# Patient Record
Sex: Female | Born: 1990 | Race: Black or African American | Hispanic: No | Marital: Single | Smoking: Never smoker
Health system: Southern US, Community
[De-identification: ages and names within clinical notes are randomized; demographics above are authoritative.]

## PROBLEM LIST (undated history)

## (undated) ENCOUNTER — Inpatient Hospital Stay (HOSPITAL_COMMUNITY): Payer: Self-pay

## (undated) DIAGNOSIS — K219 Gastro-esophageal reflux disease without esophagitis: Secondary | ICD-10-CM

---

## 2010-09-19 ENCOUNTER — Emergency Department (HOSPITAL_COMMUNITY)
Admission: EM | Admit: 2010-09-19 | Discharge: 2010-09-19 | Disposition: A | Discharge status: Home / Self Care | Payer: Managed Care, Other (non HMO) | Attending: Emergency Medicine | Admitting: Emergency Medicine

## 2010-09-19 ENCOUNTER — Inpatient Hospital Stay (INDEPENDENT_AMBULATORY_CARE_PROVIDER_SITE_OTHER)
Admission: RE | Admit: 2010-09-19 | Discharge: 2010-09-19 | Disposition: A | Discharge status: Home / Self Care | Payer: Managed Care, Other (non HMO) | Source: Ambulatory Visit | Attending: Emergency Medicine | Admitting: Emergency Medicine

## 2010-09-19 DIAGNOSIS — R112 Nausea with vomiting, unspecified: Secondary | ICD-10-CM | POA: Insufficient documentation

## 2010-09-19 DIAGNOSIS — E860 Dehydration: Secondary | ICD-10-CM | POA: Insufficient documentation

## 2010-09-19 DIAGNOSIS — K5289 Other specified noninfective gastroenteritis and colitis: Secondary | ICD-10-CM | POA: Insufficient documentation

## 2010-09-19 DIAGNOSIS — R197 Diarrhea, unspecified: Secondary | ICD-10-CM | POA: Insufficient documentation

## 2010-09-19 DIAGNOSIS — K922 Gastrointestinal hemorrhage, unspecified: Secondary | ICD-10-CM

## 2010-09-19 DIAGNOSIS — R109 Unspecified abdominal pain: Secondary | ICD-10-CM | POA: Insufficient documentation

## 2010-09-19 LAB — COMPREHENSIVE METABOLIC PANEL
BUN: 8 mg/dL (ref 6–23)
CO2: 26 mEq/L (ref 19–32)
Calcium: 9.9 mg/dL (ref 8.4–10.5)
GFR calc Af Amer: >60 mL/min (ref >60)
GFR calc non Af Amer: >60 mL/min (ref >60)
Glucose, Bld: 106 mg/dL — ABNORMAL HIGH (ref 70–99)
Total Protein: 7.5 g/dL (ref 6.0–8.3)

## 2010-09-19 LAB — URINALYSIS, MICROSCOPIC ONLY
Ketones, ur: 40 mg/dL — AB
Nitrite: NEGATIVE
Specific Gravity, Urine: 1.026 (ref 1.005–1.030)
Urobilinogen, UA: 1 mg/dL (ref 0.0–1.0)
pH: 8 (ref 5.0–8.0)

## 2010-09-19 LAB — CBC
HCT: 39.5 % (ref 36.0–46.0)
Hemoglobin: 13.9 g/dL (ref 12.0–15.0)
MCH: 30.4 pg (ref 26.0–34.0)
MCHC: 35.2 g/dL (ref 30.0–36.0)
MCV: 86.4 fL (ref 78.0–100.0)

## 2010-09-19 LAB — POCT URINALYSIS DIP (DEVICE)
Hgb urine dipstick: NEGATIVE
Ketones, ur: 40 mg/dL — AB
Leukocytes, UA: NEGATIVE
Protein, ur: 30 mg/dL — AB
pH: 7.5 (ref 5.0–8.0)

## 2010-09-19 LAB — DIFFERENTIAL
Lymphocytes Relative: 22 % (ref 12–46)
Lymphs Abs: 1.4 10*3/uL (ref 0.7–4.0)
Monocytes Absolute: 0.3 10*3/uL (ref 0.1–1.0)
Monocytes Relative: 4 % (ref 3–12)
Neutro Abs: 4.5 10*3/uL (ref 1.7–7.7)

## 2010-09-21 ENCOUNTER — Emergency Department (HOSPITAL_COMMUNITY)
Admission: EM | Admit: 2010-09-21 | Discharge: 2010-09-21 | Disposition: A | Discharge status: Home / Self Care | Payer: Managed Care, Other (non HMO) | Attending: Emergency Medicine | Admitting: Emergency Medicine

## 2010-09-21 DIAGNOSIS — R112 Nausea with vomiting, unspecified: Secondary | ICD-10-CM | POA: Insufficient documentation

## 2010-09-21 DIAGNOSIS — R1013 Epigastric pain: Secondary | ICD-10-CM | POA: Insufficient documentation

## 2010-09-21 DIAGNOSIS — R197 Diarrhea, unspecified: Secondary | ICD-10-CM | POA: Insufficient documentation

## 2010-09-21 LAB — POCT I-STAT, CHEM 8
Calcium, Ion: 1.2 mmol/L (ref 1.12–1.32)
Chloride: 102 mEq/L (ref 96–112)
Creatinine, Ser: 0.8 mg/dL (ref 0.50–1.10)
Glucose, Bld: 91 mg/dL (ref 70–99)
HCT: 39 % (ref 36.0–46.0)

## 2010-09-21 LAB — COMPREHENSIVE METABOLIC PANEL
ALT: 11 U/L (ref 0–35)
Alkaline Phosphatase: 42 U/L (ref 39–117)
BUN: 7 mg/dL (ref 6–23)
Chloride: 105 mEq/L (ref 96–112)
GFR calc Af Amer: >60 mL/min (ref >60)
Glucose, Bld: 90 mg/dL (ref 70–99)
Potassium: 3.6 mEq/L (ref 3.5–5.1)
Sodium: 141 mEq/L (ref 135–145)
Total Bilirubin: 1 mg/dL (ref 0.3–1.2)
Total Protein: 6.7 g/dL (ref 6.0–8.3)

## 2010-09-21 LAB — DIFFERENTIAL
Basophils Absolute: 0 10*3/uL (ref 0.0–0.1)
Basophils Relative: 0 % (ref 0–1)
Lymphocytes Relative: 17 % (ref 12–46)
Monocytes Absolute: 0.3 10*3/uL (ref 0.1–1.0)
Neutro Abs: 4.6 10*3/uL (ref 1.7–7.7)
Neutrophils Relative %: 78 % — ABNORMAL HIGH (ref 43–77)

## 2010-09-21 LAB — CBC
HCT: 36.6 % (ref 36.0–46.0)
Hemoglobin: 12.6 g/dL (ref 12.0–15.0)
MCHC: 34.4 g/dL (ref 30.0–36.0)
RBC: 4.25 MIL/uL (ref 3.87–5.11)
WBC: 5.9 10*3/uL (ref 4.0–10.5)

## 2010-09-21 LAB — URINE MICROSCOPIC-ADD ON

## 2010-09-21 LAB — URINALYSIS, ROUTINE W REFLEX MICROSCOPIC
Bilirubin Urine: NEGATIVE
Glucose, UA: NEGATIVE mg/dL
Hgb urine dipstick: NEGATIVE
Specific Gravity, Urine: 1.021 (ref 1.005–1.030)
Urobilinogen, UA: 1 mg/dL (ref 0.0–1.0)

## 2010-09-21 LAB — LIPASE, BLOOD: Lipase: 17 U/L (ref 11–59)

## 2013-02-03 NOTE — L&D Delivery Note (Signed)
Operative Delivery Note At 7:10 AM a viable female was delivered via Vaginal, Spontaneous Delivery.  Presentation: vertex; Position: Occiput,, Anterior  Pt ruptured ~27 hours.  Ampicillin received at 0900.  Pt remained afebrile until we started pushing. Axillary temperature 100.7.  Unasyn 3 grams administered. Pt pushed well for 50 minutes.  Delivery of the head: 11/29/2013  7:09 PM Tight nuchal chord surgically reduced.  First maneuver:  11/29/2013  7:09 PM , Suprapubic Pressure McRoberts, Head transverse, occiput to left  Second maneuver: 11/29/2013  7:10 PM, McRoberts Suprapubic Pressure Attempted to deliver posterior arm, unsuccessful  Third maneuver: ,  With suprapubic pressure, occiput to right, anterior shoulder delivered  Per RN, dystocia was 70 seconds.  Verbal consent: Pt informed of dystocia.  APGAR: 2, 3; weight 8 lb 0.8 oz (3650 g).   Placenta status: Intact, Spontaneous Pathology.   Cord: 3 vessels with the following complications: None.  Cord pH:  7.29 -arterial (small sample), venous collected  Anesthesia: Local Epidural  Episiotomy: None Lacerations: Vaginal;Labial Suture Repair: 2.0 chromic Est. Blood Loss (mL): 450  Placenta delivered at 30 minutes. Uterine atony-Cytotec 800 mcg PR, Methergine IM, I/O bladder.  Bleeding resolved.  Mom to postpartum.  Baby to NICU.  Geryl RankinsVARNADO, Jovanna Hodges 11/29/2013, 7:51 PM

## 2013-04-13 LAB — OB RESULTS CONSOLE RUBELLA ANTIBODY, IGM: RUBELLA: IMMUNE

## 2013-04-13 LAB — OB RESULTS CONSOLE ANTIBODY SCREEN: Antibody Screen: NEGATIVE

## 2013-04-13 LAB — OB RESULTS CONSOLE ABO/RH: RH TYPE: POSITIVE

## 2013-04-13 LAB — OB RESULTS CONSOLE HIV ANTIBODY (ROUTINE TESTING): HIV: NONREACTIVE

## 2013-04-13 LAB — OB RESULTS CONSOLE RPR: RPR: NONREACTIVE

## 2013-04-13 LAB — OB RESULTS CONSOLE HEPATITIS B SURFACE ANTIGEN: HEP B S AG: NEGATIVE

## 2013-04-26 ENCOUNTER — Other Ambulatory Visit (HOSPITAL_COMMUNITY)
Admission: RE | Admit: 2013-04-26 | Discharge: 2013-04-26 | Disposition: A | Discharge status: Home / Self Care | Payer: Managed Care, Other (non HMO) | Source: Ambulatory Visit | Attending: Obstetrics and Gynecology | Admitting: Obstetrics and Gynecology

## 2013-04-26 ENCOUNTER — Other Ambulatory Visit: Payer: Self-pay | Admitting: Obstetrics and Gynecology

## 2013-04-26 DIAGNOSIS — Z01419 Encounter for gynecological examination (general) (routine) without abnormal findings: Secondary | ICD-10-CM | POA: Insufficient documentation

## 2013-04-26 LAB — OB RESULTS CONSOLE GC/CHLAMYDIA
Chlamydia: NEGATIVE
Gonorrhea: NEGATIVE

## 2013-08-23 ENCOUNTER — Inpatient Hospital Stay (HOSPITAL_COMMUNITY)
Admission: AD | Admit: 2013-08-23 | Discharge: 2013-08-23 | Disposition: A | Discharge status: Home / Self Care | Payer: Managed Care, Other (non HMO) | Source: Ambulatory Visit | Attending: Obstetrics & Gynecology | Admitting: Obstetrics & Gynecology

## 2013-08-23 ENCOUNTER — Encounter (HOSPITAL_COMMUNITY): Payer: Self-pay

## 2013-08-23 ENCOUNTER — Inpatient Hospital Stay (HOSPITAL_COMMUNITY): Payer: Managed Care, Other (non HMO)

## 2013-08-23 DIAGNOSIS — W1809XA Striking against other object with subsequent fall, initial encounter: Secondary | ICD-10-CM | POA: Insufficient documentation

## 2013-08-23 DIAGNOSIS — Y929 Unspecified place or not applicable: Secondary | ICD-10-CM | POA: Insufficient documentation

## 2013-08-23 DIAGNOSIS — R55 Syncope and collapse: Secondary | ICD-10-CM

## 2013-08-23 DIAGNOSIS — O2650 Maternal hypotension syndrome, unspecified trimester: Secondary | ICD-10-CM | POA: Insufficient documentation

## 2013-08-23 LAB — COMPREHENSIVE METABOLIC PANEL
ALT: 9 U/L (ref 0–35)
AST: 15 U/L (ref 0–37)
Albumin: 3.2 g/dL — ABNORMAL LOW (ref 3.5–5.2)
Alkaline Phosphatase: 47 U/L (ref 39–117)
Anion gap: 12 (ref 5–15)
BUN: 6 mg/dL (ref 6–23)
CO2: 25 mEq/L (ref 19–32)
Calcium: 9.6 mg/dL (ref 8.4–10.5)
Chloride: 100 mEq/L (ref 96–112)
Creatinine, Ser: 0.66 mg/dL (ref 0.50–1.10)
GFR calc Af Amer: >90 mL/min (ref >90)
GFR calc non Af Amer: >90 mL/min (ref >90)
Glucose, Bld: 76 mg/dL (ref 70–99)
Potassium: 3.9 mEq/L (ref 3.7–5.3)
Sodium: 137 mEq/L (ref 137–147)
Total Bilirubin: 0.6 mg/dL (ref 0.3–1.2)
Total Protein: 6.6 g/dL (ref 6.0–8.3)

## 2013-08-23 LAB — CBC
HCT: 34.2 % — ABNORMAL LOW (ref 36.0–46.0)
Hemoglobin: 11.7 g/dL — ABNORMAL LOW (ref 12.0–15.0)
MCH: 30.9 pg (ref 26.0–34.0)
MCHC: 34.2 g/dL (ref 30.0–36.0)
MCV: 90.2 fL (ref 78.0–100.0)
Platelets: 202 10*3/uL (ref 150–400)
RBC: 3.79 MIL/uL — ABNORMAL LOW (ref 3.87–5.11)
RDW: 12.8 % (ref 11.5–15.5)
WBC: 9.3 10*3/uL (ref 4.0–10.5)

## 2013-08-23 NOTE — MAU Note (Signed)
Pt presents after a fall at 1600. States she fell on her side and hit her face. Does not believe she hit her abdomen. Reports good fetal movement. Denies vaginal bleeding or discharge.

## 2013-08-23 NOTE — MAU Provider Note (Signed)
History     CSN: [id number]  Arrival date and time: 08/23/13 1757   None     Chief Complaint  Patient presents with  . Fall   HPIpt is G1P0 at [redacted]w[redacted]d pregnant and reports syncope episode at 1600 and fell on her face.  Pt does not think She hit her abdomen; denies vaginal bleeding, discharge or LOF. Pt had eaten cereal about 10:30am.  RN note: Sharen Hint, RN Registered Nurse Signed  MAU Note Service date: 08/23/2013 6:11 PM   Pt presents after a fall at 1600. States she fell on her side and hit her face. Does not believe she hit her abdomen. Reports good fetal movement. Denies vaginal bleeding or discharge.        History reviewed. No pertinent past medical history.  History reviewed. No pertinent past surgical history.  History reviewed. No pertinent family history.  History  Substance Use Topics  . Smoking status: Never Smoker   . Smokeless tobacco: Not on file  . Alcohol Use: No    Allergies: No Known Allergies  No prescriptions prior to admission    Review of Systems  Gastrointestinal: Negative for nausea, vomiting, abdominal pain, diarrhea and constipation.  Genitourinary: Negative for dysuria and urgency.   Physical Exam   Blood pressure 119/57, pulse 68, temperature 98 F (36.7 C), temperature source Oral, resp. rate 18, last menstrual period 02/13/2013.  Physical Exam  Nursing note and vitals reviewed. Constitutional: She is oriented to person, place, and time. She appears well-developed and well-nourished. No distress.  HENT:  Head: Normocephalic.  Reddened area above lip where pt thinks she got a rug burn from the fall- not bleeding  Eyes: Pupils are equal, round, and reactive to light.  Neck: Normal range of motion. Neck supple.  Cardiovascular: Normal rate.   Respiratory: Effort normal.  GI: Soft. She exhibits no distension. There is no tenderness. There is no rebound and no guarding.  No ctx;   Musculoskeletal: Normal range of motion.   Neurological: She is alert and oriented to person, place, and time.  Skin: Skin is warm and dry.  Psychiatric: She has a normal mood and affect.    MAU Course  Procedures Results for orders placed during the hospital encounter of 08/23/13 (from the past 24 hour(s))  CBC     Status: Abnormal   Collection Time    08/23/13  6:11 PM      Result Value Ref Range   WBC 9.3  4.0 - 10.5 K/uL   RBC 3.79 (*) 3.87 - 5.11 MIL/uL   Hemoglobin 11.7 (*) 12.0 - 15.0 g/dL   HCT 47.8 (*) 29.5 - 62.1 %   MCV 90.2  78.0 - 100.0 fL   MCH 30.9  26.0 - 34.0 pg   MCHC 34.2  30.0 - 36.0 g/dL   RDW 30.8  65.7 - 84.6 %   Platelets 202  150 - 400 K/uL  COMPREHENSIVE METABOLIC PANEL     Status: Abnormal   Collection Time    08/23/13  6:11 PM      Result Value Ref Range   Sodium 137  137 - 147 mEq/L   Potassium 3.9  3.7 - 5.3 mEq/L   Chloride 100  96 - 112 mEq/L   CO2 25  19 - 32 mEq/L   Glucose, Bld 76  70 - 99 mg/dL   BUN 6  6 - 23 mg/dL   Creatinine, Ser 9.62  0.50 - 1.10 mg/dL   Calcium  9.6  8.4 - 10.5 mg/dL   Total Protein 6.6  6.0 - 8.3 g/dL   Albumin 3.2 (*) 3.5 - 5.2 g/dL   AST 15  0 - 37 U/L   ALT 9  0 - 35 U/L   Alkaline Phosphatase 47  39 - 117 U/L   Total Bilirubin 0.6  0.3 - 1.2 mg/dL   GFR calc non Af Amer >90  >90 mL/min   GFR calc Af Amer >90  >90 mL/min   Anion gap 12  5 - 15   Preliminary ultrasound results- no placental abruption; posterior placenta above cervical os; normal appearing ultrasound Pt denies uterine irritability or cramping; FHR 6-25 beats accel with 10x10; no deceleration - no ctx noted; monitored for total of 4 hours after episode Discussed with Dr. Charlotta Newtonzan Assessment and Plan  Syncope in pregnancy F/u with scheduled OB appointment- sooner if any pain or bleeding or  concerns  [place] 08/23/2013, 6:34 PM

## 2013-10-20 LAB — OB RESULTS CONSOLE GBS: STREP GROUP B AG: NEGATIVE

## 2013-11-27 ENCOUNTER — Encounter (HOSPITAL_COMMUNITY): Payer: Self-pay | Admitting: *Deleted

## 2013-11-27 ENCOUNTER — Inpatient Hospital Stay (HOSPITAL_COMMUNITY)
Admission: AD | Admit: 2013-11-27 | Discharge: 2013-12-01 | DRG: 774 | Disposition: A | Discharge status: Home / Self Care | Payer: Managed Care, Other (non HMO) | Source: Ambulatory Visit | Attending: Obstetrics and Gynecology | Admitting: Obstetrics and Gynecology

## 2013-11-27 DIAGNOSIS — O480 Post-term pregnancy: Principal | ICD-10-CM | POA: Diagnosis present

## 2013-11-27 DIAGNOSIS — O9902 Anemia complicating childbirth: Secondary | ICD-10-CM | POA: Diagnosis present

## 2013-11-27 DIAGNOSIS — Z3490 Encounter for supervision of normal pregnancy, unspecified, unspecified trimester: Secondary | ICD-10-CM

## 2013-11-27 DIAGNOSIS — F329 Major depressive disorder, single episode, unspecified: Secondary | ICD-10-CM | POA: Diagnosis present

## 2013-11-27 DIAGNOSIS — K219 Gastro-esophageal reflux disease without esophagitis: Secondary | ICD-10-CM | POA: Diagnosis present

## 2013-11-27 DIAGNOSIS — Z3A41 41 weeks gestation of pregnancy: Secondary | ICD-10-CM | POA: Diagnosis present

## 2013-11-27 DIAGNOSIS — O99214 Obesity complicating childbirth: Secondary | ICD-10-CM | POA: Diagnosis present

## 2013-11-27 DIAGNOSIS — S70222A Blister (nonthermal), left hip, initial encounter: Secondary | ICD-10-CM

## 2013-11-27 DIAGNOSIS — Z6830 Body mass index (BMI) 30.0-30.9, adult: Secondary | ICD-10-CM | POA: Diagnosis not present

## 2013-11-27 DIAGNOSIS — O1493 Unspecified pre-eclampsia, third trimester: Secondary | ICD-10-CM | POA: Diagnosis present

## 2013-11-27 HISTORY — DX: Gastro-esophageal reflux disease without esophagitis: K21.9

## 2013-11-27 LAB — TYPE AND SCREEN
ABO/RH(D): O POS
ANTIBODY SCREEN: NEGATIVE

## 2013-11-27 LAB — CBC
HCT: 35 % — ABNORMAL LOW (ref 36.0–46.0)
Hemoglobin: 12.1 g/dL (ref 12.0–15.0)
MCH: 31 pg (ref 26.0–34.0)
MCHC: 34.6 g/dL (ref 30.0–36.0)
MCV: 89.7 fL (ref 78.0–100.0)
PLATELETS: 236 10*3/uL (ref 150–400)
RBC: 3.9 MIL/uL (ref 3.87–5.11)
RDW: 13.7 % (ref 11.5–15.5)
WBC: 10.8 10*3/uL — AB (ref 4.0–10.5)

## 2013-11-27 MED ORDER — CITRIC ACID-SODIUM CITRATE 334-500 MG/5ML PO SOLN: 30.0000 mL | ORAL | Status: DC | PRN

## 2013-11-27 MED ORDER — ACETAMINOPHEN 325 MG PO TABS
650.0000 mg | ORAL_TABLET | ORAL | Status: DC | PRN
  Administered 2013-11-29: 650 mg via ORAL
  Filled 2013-11-27: qty 2

## 2013-11-27 MED ORDER — LACTATED RINGERS IV SOLN
500.0000 mL | INTRAVENOUS | Status: DC | PRN
Start: 2013-11-27 — End: 2013-11-30
  Administered 2013-11-28: 1000 mL via INTRAVENOUS

## 2013-11-27 MED ORDER — LIDOCAINE HCL (PF) 1 % IJ SOLN
30.0000 mL | INTRAMUSCULAR | Status: AC | PRN
  Administered 2013-11-29: 30 mL via SUBCUTANEOUS
  Filled 2013-11-27: qty 30

## 2013-11-27 MED ORDER — MISOPROSTOL 25 MCG QUARTER TABLET
25.0000 ug | ORAL_TABLET | ORAL | Status: DC | PRN
  Administered 2013-11-27 – 2013-11-28 (×2): 25 ug via VAGINAL
  Administered 2013-11-29: 800 ug via VAGINAL
  Filled 2013-11-27: qty 1
  Filled 2013-11-27 (×3): qty 0.25

## 2013-11-27 MED ORDER — TERBUTALINE SULFATE 1 MG/ML IJ SOLN: 0.2500 mg | Freq: Once | INTRAMUSCULAR | Status: AC | PRN

## 2013-11-27 MED ORDER — OXYTOCIN BOLUS FROM INFUSION
500.0000 mL | INTRAVENOUS | Status: DC
  Administered 2013-11-29: 500 mL via INTRAVENOUS

## 2013-11-27 MED ORDER — LACTATED RINGERS IV SOLN
INTRAVENOUS | Status: DC
  Administered 2013-11-27 – 2013-11-29 (×7): via INTRAVENOUS

## 2013-11-27 MED ORDER — OXYCODONE-ACETAMINOPHEN 5-325 MG PO TABS: 2.0000 | ORAL_TABLET | ORAL | Status: DC | PRN

## 2013-11-27 MED ORDER — OXYCODONE-ACETAMINOPHEN 5-325 MG PO TABS: 1.0000 | ORAL_TABLET | ORAL | Status: DC | PRN

## 2013-11-27 MED ORDER — OXYTOCIN 40 UNITS IN LACTATED RINGERS INFUSION - SIMPLE MED
62.5000 mL/h | INTRAVENOUS | Status: DC
  Administered 2013-11-29: 62.5 mL/h via INTRAVENOUS
  Filled 2013-11-27: qty 1000

## 2013-11-27 MED ORDER — ONDANSETRON HCL 4 MG/2ML IJ SOLN: 4.0000 mg | Freq: Four times a day (QID) | INTRAMUSCULAR | Status: DC | PRN

## 2013-11-27 NOTE — Plan of Care (Signed)
Problem: Consults Goal: Birthing Suites Patient Information Press F2 to bring up selections list Outcome: Completed/Met Date Met:  11/27/13  Pt > [redacted] weeks EGA

## 2013-11-28 ENCOUNTER — Encounter (HOSPITAL_COMMUNITY): Payer: Managed Care, Other (non HMO) | Admitting: Anesthesiology

## 2013-11-28 ENCOUNTER — Inpatient Hospital Stay (HOSPITAL_COMMUNITY): Payer: Managed Care, Other (non HMO) | Admitting: Anesthesiology

## 2013-11-28 LAB — ABO/RH: ABO/RH(D): O POS

## 2013-11-28 LAB — RPR

## 2013-11-28 MED ORDER — LACTATED RINGERS IV SOLN: 500.0000 mL | Freq: Once | INTRAVENOUS | Status: DC

## 2013-11-28 MED ORDER — OXYTOCIN 40 UNITS IN LACTATED RINGERS INFUSION - SIMPLE MED
1.0000 m[IU]/min | INTRAVENOUS | Status: DC
  Administered 2013-11-28: 2 m[IU]/min via INTRAVENOUS

## 2013-11-28 MED ORDER — PHENYLEPHRINE 40 MCG/ML (10ML) SYRINGE FOR IV PUSH (FOR BLOOD PRESSURE SUPPORT)
80.0000 ug | PREFILLED_SYRINGE | INTRAVENOUS | Status: DC | PRN
  Filled 2013-11-28: qty 2
  Filled 2013-11-28: qty 10

## 2013-11-28 MED ORDER — FENTANYL 2.5 MCG/ML BUPIVACAINE 1/10 % EPIDURAL INFUSION (WH - ANES)
14.0000 mL/h | INTRAMUSCULAR | Status: DC | PRN
  Administered 2013-11-28 – 2013-11-29 (×3): 14 mL/h via EPIDURAL
  Filled 2013-11-28 (×5): qty 125

## 2013-11-28 MED ORDER — BUTORPHANOL TARTRATE 1 MG/ML IJ SOLN
2.0000 mg | INTRAMUSCULAR | Status: DC | PRN
  Administered 2013-11-28 (×3): 2 mg via INTRAVENOUS
  Filled 2013-11-28 (×3): qty 2

## 2013-11-28 MED ORDER — EPHEDRINE 5 MG/ML INJ
10.0000 mg | INTRAVENOUS | Status: DC | PRN
  Filled 2013-11-28: qty 2

## 2013-11-28 MED ORDER — FENTANYL 2.5 MCG/ML BUPIVACAINE 1/10 % EPIDURAL INFUSION (WH - ANES)
INTRAMUSCULAR | Status: DC | PRN
  Administered 2013-11-28: 14 mL/h via EPIDURAL

## 2013-11-28 MED ORDER — PHENYLEPHRINE 40 MCG/ML (10ML) SYRINGE FOR IV PUSH (FOR BLOOD PRESSURE SUPPORT)
80.0000 ug | PREFILLED_SYRINGE | INTRAVENOUS | Status: DC | PRN
  Filled 2013-11-28: qty 10
  Filled 2013-11-28: qty 2

## 2013-11-28 MED ORDER — DIPHENHYDRAMINE HCL 50 MG/ML IJ SOLN: 12.5000 mg | INTRAMUSCULAR | Status: DC | PRN

## 2013-11-28 MED ORDER — LIDOCAINE HCL (PF) 1 % IJ SOLN
INTRAMUSCULAR | Status: DC | PRN
  Administered 2013-11-28 (×2): 4 mL

## 2013-11-28 MED ORDER — TERBUTALINE SULFATE 1 MG/ML IJ SOLN: 0.2500 mg | Freq: Once | INTRAMUSCULAR | Status: AC | PRN

## 2013-11-28 NOTE — Progress Notes (Addendum)
Rebecca Weiss is a 23 y.o. G1P0 at 10721w1d   Subjective: Pt currently comfortable.  Stadol IV relieved pain with contractions previously.  Objective: BP 122/81  Pulse 69  Temp(Src) 97.9 F (36.6 C) (Oral)  Resp 18  Ht 5' 3.5" (1.613 m)  Wt 79.379 kg (175 lb)  BMI 30.51 kg/m2  SpO2 100%  LMP 02/13/2013     Gen:  NAD, shivering. FHT:  110s, reactive, mild variable decels noted, good variability UC:   regular, every 1-3 minutes SVE:   Dilation: 1 Effacement (%): 50 Station: -3 Exam by:: Dr Dion BodyVarnado Midposition, moderate consistency Labs: Lab Results  Component Value Date   WBC 10.8* 11/27/2013   HGB 12.1 11/27/2013   HCT 35.0* 11/27/2013   MCV 89.7 11/27/2013   PLT 236 11/27/2013    Assessment / Plan: IUP at 41 1/7 weeks IOL due to postdates.  Latent labor s/p Cytotec x 2.  Labor: Progressing normally Start Pitocin per protocol, 2 mUs increase by 2 mUs. Preeclampsia:  no signs or symptoms of toxicity Fetal Wellbeing:  Category II, reassuring Pain Control:  Epidural prn I/D:  GBS neg. Anticipated MOD:  NSVD  Rebecca Weiss 11/28/2013, 8:36 AM

## 2013-11-28 NOTE — Progress Notes (Signed)
Per RN, SROM at 1500.  Cervix still 1 cm at 1700. Pt with severe pain with contractions.  Pitocin off x 20 minutes. Gen:  Severe distress with contractions, pt tearful Abd:  Contractions palpate moderate. Cvx 2/60/-2, clear fluid EM: Decreased variability likely due to Stadol, Accelerations, occasional variable.  Contractions q 2 min s/p foley bulb.  Pt given Stadol 1 mg IV prior to insertion.  Attempted to place foley bulb with sterile technique, betadine used.  After inflation and traction, bulb palpated in vagina.  Foley bulb placed digitally and placed on traction.  A/P IUP at 41 1/7 weeks, IOL due to postdates, minimal progress. SROM Foley bulb in place. Jehovah's witness.  Briefly discussed with pt.  Said she can take plasma and then stated she would accept a transfusion if necessary to save her life. Epidural per anesthesia.  Foley catheter. May resume Pitocin in a few hours or once Foley bulb expelled. Plan of care d/w pt and FOB.

## 2013-11-28 NOTE — H&P (Signed)
Rebecca Weiss is a 23 y.o. female G1 at 4541 1/7 weeks c/w an 8 weeks ultrasound admitted for IOL due to postdates.. Pregnancy has been uncomplicated.  Pt is non-immune to chicken pox.  She also has a h/o depression. GBS negative.  Maternal Medical History:  Reason for admission: Induction of labor.  Contractions: Onset was 1 week ago.   Frequency: irregular.   Perceived severity is mild.    Fetal activity: Perceived fetal activity is normal.    Prenatal complications: no prenatal complications Prenatal Complications - Diabetes: none.    OB History   Grav Para Term Preterm Abortions TAB SAB Ect Mult Living   1              Past Medical History  Diagnosis Date  . GERD (gastroesophageal reflux disease)    History reviewed. No pertinent past surgical history. Family History: family history includes Diabetes in her paternal grandmother; Hypertension in her mother. Social History:  reports that she has never smoked. She has never used smokeless tobacco. She reports that she does not drink alcohol or use illicit drugs.   Prenatal Transfer Tool  Maternal Diabetes: No Genetic Screening: Normal Maternal Ultrasounds/Referrals: Normal Fetal Ultrasounds or other Referrals:  None Maternal Substance Abuse:  No Significant Maternal Medications:  None Significant Maternal Lab Results:  Lab values include: Group B Strep negative Other Comments:  None  Review of Systems  Gastrointestinal: Positive for abdominal pain.  Genitourinary:       Denies LOF or VB.    Dilation: 1 Effacement (%): Thick Station: -3 Exam by:: Sandrea HughsKatie Forsell, RNC Blood pressure 131/73, pulse 70, temperature 98.7 F (37.1 C), temperature source Oral, resp. rate 20, height 5' 3.5" (1.613 m), weight 79.379 kg (175 lb), last menstrual period 02/13/2013, SpO2 100.00%. Maternal Exam:  Abdomen: Patient reports no abdominal tenderness. Estimated fetal weight is 7 lbs.   Fetal presentation: vertex  Introitus: Normal  vulva. Pelvis: adequate for delivery.   Cervix: Cervix evaluated by digital exam.     Fetal Exam Fetal Monitor Review: Mode: hand-held doppler probe.   Baseline rate: 140s.      Physical Exam  Constitutional: She is oriented to person, place, and time. She appears well-developed and well-nourished. No distress.  HENT:  Head: Normocephalic and atraumatic.  Eyes: EOM are normal.  Neck: Normal range of motion.  Respiratory: Effort normal. No respiratory distress.  GI: Soft.  Genitourinary: Vagina normal.  Cervix closed, midposition, moderate consistency  Musculoskeletal: Normal range of motion. She exhibits edema. She exhibits no tenderness.  Neurological: She is alert and oriented to person, place, and time.  Skin: Skin is warm and dry. She is not diaphoretic.  Psychiatric: She has a normal mood and affect.    Prenatal labs: ABO, Rh: --/--/O POS (10/25 2115) Antibody: NEG (10/25 2115) Rubella: Immune (03/11 0000) RPR: NON REAC (10/25 2115)  HBsAg: Negative (03/11 0000)  HIV: Non-reactive (03/11 0000)  GBS: Negative (09/17 0000)   Assessment/Plan: IUP @ 40 4/7 weeks. IOL due to postdates on 10/25.[name].  Reviewed R/B/A of IOL.  Pt desires to proceed. Cytotec per vagina until 2 cm then Pitocin. Pt does not want an epidural.   [name] 11/28/2013, 4:47 AM

## 2013-11-28 NOTE — Progress Notes (Signed)
  Subjective: Assuming care of this 23 yo G1P0 pt @ 41.1 wks who is currently being induced due to late-term pregnancy. Introduced self to pt and family. No c/o - comfortable w/ epidural.   Foley bulb out around 11 pm.   Objective: BP 124/66  Pulse 73  Temp(Src) 98.6 F (37 C) (Oral)  Resp 20  Ht 5' 3.5" (1.613 m)  Wt 175 lb (79.379 kg)  BMI 30.51 kg/m2  SpO2 100%  LMP 02/13/2013     FHT: BL 125 w/ mod variability, +accels, occ early, no decels UC:   irregular, every 2-4 minutes SVE: 4/60/-2    Pitocin at 6 mius/min IUPC placed at approx 11:25 PM; bloody show present, line flushed and catheter zeroed x2 Urine clear and output adequate  Assessment:  IUP at 41.1 wks IOL due to late-term pregnancy Early labor Cat 2 FHRT - due to early, o/w Cat 1 Jehovah's Witness Inadequate MVUs  Plan: Continue w/ current management plan Consult prn  Sherre ScarletWILLIAMS, Jojo Pehl CNM 11/28/2013, 11:43 PM

## 2013-11-28 NOTE — Progress Notes (Signed)
Pt more comfortable s/p epidural. Contractions moderate to palpation. Contractions ~ every 4 minutes. Vertex by ultrasound. Resume Pitocin at 1 mU increase by 1-2 milliunits. Readdressed use of blood products due to h/o Jehovah's witness.  Pt states she would accept PRBCs if necessary to save her life.

## 2013-11-28 NOTE — Progress Notes (Signed)
Dr. Idamae SchullerVarnardo verified fetal presentation w/ bed ultrasound prior to restaritng Pitocin. Fetus vertex.

## 2013-11-28 NOTE — Anesthesia Procedure Notes (Signed)
Epidural Patient location during procedure: OB Start time: 11/28/2013 7:04 PM  Staffing Anesthesiologist: Keandria Berrocal A. Performed by: anesthesiologist   Preanesthetic Checklist Completed: patient identified, site marked, surgical consent, pre-op evaluation, timeout performed, IV checked, risks and benefits discussed and monitors and equipment checked  Epidural Patient position: sitting Prep: site prepped and draped and DuraPrep Patient monitoring: continuous pulse ox and blood pressure Approach: midline Location: L3-L4 Injection technique: LOR air  Needle:  Needle type: Tuohy  Needle gauge: 17 G Needle length: 9 cm and 9 Needle insertion depth: 5 cm cm Catheter type: closed end flexible Catheter size: 19 Gauge Catheter at skin depth: 10 cm Test dose: negative and Other  Assessment Events: blood not aspirated, injection not painful, no injection resistance, negative IV test and no paresthesia  Additional Notes Patient identified. Risks and benefits discussed including failed block, incomplete  Pain control, post dural puncture headache, nerve damage, paralysis, blood pressure Changes, nausea, vomiting, reactions to medications-both toxic and allergic and post Partum back pain. All questions were answered. Patient expressed understanding and wished to proceed. Sterile technique was used throughout procedure. Epidural site was Dressed with sterile barrier dressing. No paresthesias, signs of intravascular injection Or signs of intrathecal spread were encountered.  Patient was more comfortable after the epidural was dosed. Please see RN's note for documentation of vital signs and FHR which are stable.

## 2013-11-28 NOTE — Progress Notes (Signed)
Pt with contractions, requesting epidural.  Pain relieved with Stadol Reassuring fetal tracing. Contractions every 1-4 Pitocin 16 munits Cervix unchanged, 1 cm.  IOL at 41 1/7 weeks. Encouraged pt to defer epidural for now until cervical change.  Encouraged birthing ball and ambulation. Cont Stadol prn.

## 2013-11-28 NOTE — Anesthesia Preprocedure Evaluation (Signed)
Anesthesia Evaluation  Patient identified by MRN, date of birth, ID band Patient awake    Reviewed: Allergy & Precautions, H&P , Patient's Chart, lab work & pertinent test results  Airway Mallampati: III  TM Distance: >3 FB Neck ROM: full    Dental no notable dental hx. (+) Teeth Intact   Pulmonary neg pulmonary ROS,  breath sounds clear to auscultation  Pulmonary exam normal       Cardiovascular negative cardio ROS  Rhythm:regular Rate:Normal     Neuro/Psych negative neurological ROS  negative psych ROS   GI/Hepatic Neg liver ROS, GERD-  Medicated and Controlled,  Endo/Other  Obesity  Renal/GU negative Renal ROS  negative genitourinary   Musculoskeletal   Abdominal Normal abdominal exam  (+)   Peds  Hematology negative hematology ROS (+) anemia ,   Anesthesia Other Findings   Reproductive/Obstetrics (+) Pregnancy                             Anesthesia Physical Anesthesia Plan  ASA: II  Anesthesia Plan: Epidural   Post-op Pain Management:    Induction:   Airway Management Planned:   Additional Equipment:   Intra-op Plan:   Post-operative Plan:   Informed Consent: I have reviewed the patients History and Physical, chart, labs and discussed the procedure including the risks, benefits and alternatives for the proposed anesthesia with the patient or authorized representative who has indicated his/her understanding and acceptance.     Plan Discussed with: Anesthesiologist  Anesthesia Plan Comments:         Anesthesia Quick Evaluation

## 2013-11-29 ENCOUNTER — Encounter (HOSPITAL_COMMUNITY): Payer: Self-pay | Admitting: *Deleted

## 2013-11-29 MED ORDER — ONDANSETRON HCL 4 MG/2ML IJ SOLN
4.0000 mg | INTRAMUSCULAR | Status: DC | PRN
Start: 2013-11-29 — End: 2013-12-01

## 2013-11-29 MED ORDER — SODIUM CHLORIDE 0.9 % IV SOLN
3.0000 g | Freq: Four times a day (QID) | INTRAVENOUS | Status: DC
  Administered 2013-11-29: 3 g via INTRAVENOUS
  Filled 2013-11-29 (×2): qty 3

## 2013-11-29 MED ORDER — SIMETHICONE 80 MG PO CHEW: 80.0000 mg | CHEWABLE_TABLET | ORAL | Status: DC | PRN

## 2013-11-29 MED ORDER — MAGNESIUM HYDROXIDE 400 MG/5ML PO SUSP: 30.0000 mL | ORAL | Status: DC | PRN

## 2013-11-29 MED ORDER — DIPHENHYDRAMINE HCL 25 MG PO CAPS: 25.0000 mg | ORAL_CAPSULE | Freq: Four times a day (QID) | ORAL | Status: DC | PRN

## 2013-11-29 MED ORDER — OXYTOCIN 40 UNITS IN LACTATED RINGERS INFUSION - SIMPLE MED: 62.5000 mL/h | INTRAVENOUS | Status: DC | PRN

## 2013-11-29 MED ORDER — BENZOCAINE-MENTHOL 20-0.5 % EX AERO
1.0000 "application " | INHALATION_SPRAY | CUTANEOUS | Status: DC | PRN
  Administered 2013-11-30: 1 via TOPICAL
  Filled 2013-11-29: qty 56

## 2013-11-29 MED ORDER — DIBUCAINE 1 % RE OINT: 1.0000 "application " | TOPICAL_OINTMENT | RECTAL | Status: DC | PRN

## 2013-11-29 MED ORDER — WITCH HAZEL-GLYCERIN EX PADS: 1.0000 "application " | MEDICATED_PAD | CUTANEOUS | Status: DC | PRN

## 2013-11-29 MED ORDER — LANOLIN HYDROUS EX OINT: TOPICAL_OINTMENT | CUTANEOUS | Status: DC | PRN

## 2013-11-29 MED ORDER — ONDANSETRON HCL 4 MG PO TABS: 4.0000 mg | ORAL_TABLET | ORAL | Status: DC | PRN

## 2013-11-29 MED ORDER — SODIUM CHLORIDE 0.9 % IV SOLN
3.0000 g | Freq: Four times a day (QID) | INTRAVENOUS | Status: AC
  Administered 2013-11-30 (×3): 3 g via INTRAVENOUS
  Filled 2013-11-29 (×3): qty 3

## 2013-11-29 MED ORDER — IBUPROFEN 600 MG PO TABS
600.0000 mg | ORAL_TABLET | Freq: Four times a day (QID) | ORAL | Status: DC
  Administered 2013-11-30 – 2013-12-01 (×7): 600 mg via ORAL
  Filled 2013-11-29 (×7): qty 1

## 2013-11-29 MED ORDER — FERROUS SULFATE 325 (65 FE) MG PO TABS
325.0000 mg | ORAL_TABLET | Freq: Two times a day (BID) | ORAL | Status: DC
  Administered 2013-11-30 – 2013-12-01 (×4): 325 mg via ORAL
  Filled 2013-11-29 (×4): qty 1

## 2013-11-29 MED ORDER — TETANUS-DIPHTH-ACELL PERTUSSIS 5-2.5-18.5 LF-MCG/0.5 IM SUSP: 0.5000 mL | Freq: Once | INTRAMUSCULAR | Status: DC

## 2013-11-29 MED ORDER — METHYLERGONOVINE MALEATE 0.2 MG PO TABS: 0.2000 mg | ORAL_TABLET | ORAL | Status: DC | PRN

## 2013-11-29 MED ORDER — LACTATED RINGERS IV SOLN
INTRAVENOUS | Status: DC
  Administered 2013-11-29: 06:00:00 via INTRAUTERINE

## 2013-11-29 MED ORDER — MISOPROSTOL 200 MCG PO TABS
200.0000 ug | ORAL_TABLET | Freq: Four times a day (QID) | ORAL | Status: AC
  Administered 2013-11-30 (×4): 200 ug via ORAL
  Filled 2013-11-29 (×5): qty 1

## 2013-11-29 MED ORDER — METHYLERGONOVINE MALEATE 0.2 MG/ML IJ SOLN
INTRAMUSCULAR | Status: AC
  Administered 2013-11-29: 0.2 mg via INTRAMUSCULAR
  Filled 2013-11-29: qty 1

## 2013-11-29 MED ORDER — SENNOSIDES-DOCUSATE SODIUM 8.6-50 MG PO TABS
2.0000 | ORAL_TABLET | ORAL | Status: DC
  Administered 2013-11-30: 2 via ORAL
  Filled 2013-11-29 (×2): qty 2

## 2013-11-29 MED ORDER — MEASLES, MUMPS & RUBELLA VAC ~~LOC~~ INJ: 0.5000 mL | INJECTION | Freq: Once | SUBCUTANEOUS | Status: DC

## 2013-11-29 MED ORDER — METHYLERGONOVINE MALEATE 0.2 MG/ML IJ SOLN: 0.2000 mg | INTRAMUSCULAR | Status: DC | PRN

## 2013-11-29 MED ORDER — AMPICILLIN SODIUM 1 G IJ SOLR
1.0000 g | Freq: Once | INTRAMUSCULAR | Status: AC
  Administered 2013-11-29: 1 g via INTRAVENOUS
  Filled 2013-11-29: qty 1000

## 2013-11-29 MED ORDER — OXYCODONE-ACETAMINOPHEN 5-325 MG PO TABS: 2.0000 | ORAL_TABLET | ORAL | Status: DC | PRN

## 2013-11-29 MED ORDER — PRENATAL MULTIVITAMIN CH
1.0000 | ORAL_TABLET | Freq: Every day | ORAL | Status: DC
  Administered 2013-11-30 – 2013-12-01 (×2): 1 via ORAL
  Filled 2013-11-29 (×2): qty 1

## 2013-11-29 MED ORDER — ZOLPIDEM TARTRATE 5 MG PO TABS: 5.0000 mg | ORAL_TABLET | Freq: Every evening | ORAL | Status: DC | PRN

## 2013-11-29 MED ORDER — MISOPROSTOL 200 MCG PO TABS
ORAL_TABLET | ORAL | Status: AC
  Filled 2013-11-29: qty 4

## 2013-11-29 MED ORDER — OXYCODONE-ACETAMINOPHEN 5-325 MG PO TABS: 1.0000 | ORAL_TABLET | ORAL | Status: DC | PRN

## 2013-11-29 NOTE — Progress Notes (Signed)
  Subjective: No c/o. Family at bedside.  Objective: BP 114/62  Pulse 79  Temp(Src) 99.2 F (37.3 C) (Oral)  Resp 20  Ht 5' 3.5" (1.613 m)  Wt 175 lb (79.379 kg)  BMI 30.51 kg/m2  SpO2 100%  LMP 02/13/2013   Total I/O In: -  Out: 575 [Urine:575]  FHT: BL FHR 120 w/ mod variability, +accels, earlys, no late decels UC:   irregular, every 2-3 minutes MVUs 225 SVE: Deferred  Assessment:  IUP at 41.2 wks; IOL due to late term pregnancy Cat 2 FHRT (earlys; o/w Cat 1) Early labor GBS neg Adequate MVUs  Plan: Continue w/ current management plan Consult prn  Sherre ScarletWILLIAMS, Sareen Randon CNM 11/29/2013, 01:30 AM

## 2013-11-29 NOTE — Progress Notes (Signed)
Pt comfortable. VSS Cvx 5-6 cm, still thicker (70%) on right. EM: Reassuring.   Fetus very active when lifting head. Copious amount of fluid released when head lifted off cervix. MVUs inadequate possible due to hydro distension. Continue with Pitocin.

## 2013-11-29 NOTE — Progress Notes (Signed)
Pt. Is a Luan Moorejehovah witness and will receive blood products post delivery if needed

## 2013-11-29 NOTE — Progress Notes (Addendum)
  Subjective: Comfortable. No c/o.  Objective: BP 121/64  Pulse 78  Temp(Src) 99.2 F (37.3 C) (Oral)  Resp 18  Ht 5' 3.5" (1.613 m)  Wt 175 lb (79.379 kg)  BMI 30.51 kg/m2  SpO2 100%  LMP 02/13/2013   Total I/O In: -  Out: 575 [Urine:575]  FHT: BL 125 w/ mod variability, +accels, mod variables, earlys, one late noted when pt supine during cervical exam UC:   regular, every 2 minutes SVE:   Dilation: 4 Effacement (%): 90 Station: 0 Exam by:: Thornell MuleK. Nelton Amsden, CNM Pitocin at 19 mius/min +bloody show Vagina edematous  Assessment:  Prolonged latent phase; making slow change Cat 2 FHRT  Plan: Pitocin off for now Begin Amnioinfusion Implement maternal resuscitative measures  Rebecca Weiss, Rebecca Treat CNM 11/29/2013, 6:04 AM

## 2013-11-29 NOTE — Progress Notes (Signed)
  Subjective: Sleeping, yet easily aroused. Comfortable w/ epidural.  Objective: BP 114/62  Pulse 79  Temp(Src) 99.2 F (37.3 C) (Oral)  Resp 20  Ht 5' 3.5" (1.613 m)  Wt 175 lb (79.379 kg)  BMI 30.51 kg/m2  SpO2 100%  LMP 02/13/2013   Total I/O In: -  Out: 575 [Urine:575]  FHT: BL 120 w/ mod variability, +accels, occ variable, earlys UC:   regular, every 2 minutes MVUs 270 SVE:   Dilation: 4 Effacement (%): 70 Station: -2 Exam by:: F. Morris, RNC Pitocin at 17 mius/min  Assessment:  IUP at 41.2 wks Cat 2 FHRT Early labor  Plan: Monitor closely Consult prn  Sherre ScarletWILLIAMS, Matie Dimaano CNM 11/29/2013, 4:02 AM

## 2013-11-29 NOTE — Progress Notes (Signed)
Rebecca Weiss is a 23 y.o. G1P0 at 2441w2d  Subjective: Pt comfortable but "I'm feeling pressure in my butt."    Objective: BP 102/64  Pulse 86  Temp(Src) 98.6 F (37 C) (Oral)  Resp 18  Ht 5' 3.5" (1.613 m)  Wt 79.379 kg (175 lb)  BMI 30.51 kg/m2  SpO2 100%  LMP 02/13/2013 I/O last 3 completed shifts: In: -  Out: 575 [Urine:575] Total I/O In: -  Out: 1000 [Urine:1000]  FHT:  Reactive, early decels, +Fetal scalp stimulation UC:   regular, every 3-4 minutes SVE:   Dilation: 7.5 Effacement (%): 90 Station: 0;+1 Exam by:: Dr. Dion BodyVarnado Thickened cervix on right side is much thinner MVUs104-195. 190-195 in the last 30 minutes Labs: Lab Results  Component Value Date   WBC 10.8* 11/27/2013   HGB 12.1 11/27/2013   HCT 35.0* 11/27/2013   MCV 89.7 11/27/2013   PLT 236 11/27/2013    Assessment / Plan: Protracted latent phase, progressing now in active phase MVUs adequate in the last 30 minutes  Labor: Progressing on 14 mUs of Pitocin Preeclampsia:  no signs or symptoms of toxicity Fetal Wellbeing:  Category II Pain Control:  Epidural I/D:  On Ampicillin Anticipated MOD:  NSVD  Rebecca Weiss 11/29/2013, 2:48 PM

## 2013-11-29 NOTE — Progress Notes (Signed)
Pt to NICU via wheelchair to see infant prior to transferring to [facility]

## 2013-11-29 NOTE — Progress Notes (Signed)
Rebecca Weiss is a 23 y.o. G1P0 at 1360w2d   Subjective: Pt comfortable overnight.  Active fetal movement.  Objective: BP 110/61  Pulse 74  Temp(Src) 98.8 F (37.1 C) (Oral)  Resp 18  Ht 5' 3.5" (1.613 m)  Wt 79.379 kg (175 lb)  BMI 30.51 kg/m2  SpO2 100%  LMP 02/13/2013 I/O last 3 completed shifts: In: -  Out: 575 [Urine:575]    FHT:  110s, reactive, early decels.  Moderate variability UC:   Irregular up to 5 minutes  SVE:   Dilation: 4 Effacement (%): 90 Station: 0 Exam by:: Dr. Dion BodyVarnado  Labs: Lab Results  Component Value Date   WBC 10.8* 11/27/2013   HGB 12.1 11/27/2013   HCT 35.0* 11/27/2013   MCV 89.7 11/27/2013   PLT 236 11/27/2013    Assessment / Plan: IUP at 41 2/7 weeks Protracted labor. Previously with Cat 2 tracing. Improved after Pitocin discontinued. Amnioinfusion in progress.  Will discontinue if poor return of fluid.  Head well applied to cervix. Labor: Resume Pitocin Preeclampsia:  no signs or symptoms of toxicity Fetal Wellbeing:  Category II (early decels, overall reassuring) Pain Control:  Epidural I/D:  Start Ampicillin at 9am for prolonged ROM. Anticipated MOD:  Possible c-section.  Geryl RankinsVARNADO, Rebecca Weiss 11/29/2013, 8:38 AM

## 2013-11-30 LAB — CBC
HEMATOCRIT: 31.9 % — AB (ref 36.0–46.0)
Hemoglobin: 11.1 g/dL — ABNORMAL LOW (ref 12.0–15.0)
MCH: 31.6 pg (ref 26.0–34.0)
MCHC: 34.8 g/dL (ref 30.0–36.0)
MCV: 90.9 fL (ref 78.0–100.0)
Platelets: 187 10*3/uL (ref 150–400)
RBC: 3.51 MIL/uL — AB (ref 3.87–5.11)
RDW: 13.7 % (ref 11.5–15.5)
WBC: 18.7 10*3/uL — ABNORMAL HIGH (ref 4.0–10.5)

## 2013-11-30 MED ORDER — DIPHENHYDRAMINE HCL 25 MG PO CAPS
25.0000 mg | ORAL_CAPSULE | Freq: Four times a day (QID) | ORAL | Status: DC | PRN
  Administered 2013-11-30: 25 mg via ORAL
  Filled 2013-11-30: qty 1

## 2013-11-30 MED ORDER — BACITRACIN-NEOMYCIN-POLYMYXIN OINTMENT TUBE
TOPICAL_OINTMENT | CUTANEOUS | Status: DC | PRN
  Administered 2013-11-30: 19:00:00 via TOPICAL
  Filled 2013-11-30: qty 15

## 2013-11-30 NOTE — Progress Notes (Signed)
Raised blister area on upper left thigh that patient reports as sore. Doctor notified.

## 2013-11-30 NOTE — Progress Notes (Signed)
Postpartum day #1, NSVD  Subjective Pt c/o blister on left hip.  Started as a small blister, now it is significantly larger.  Denies pain other the pressure.  Denies having any issues prior to this morning. Lochia normal.  Pain controlled.   Temp:  [97.8 F (36.6 C)-100.7 F (38.2 C)] 98.6 F (37 C) (10/28 1100) Pulse Rate:  [63-91] 77 (10/28 1100) Resp:  [18-20] 18 (10/28 1100) BP: (100-147)/(52-89) 106/52 mmHg (10/28 1100) SpO2:  [99 %-100 %] 99 % (10/28 1100)  Gen:  NAD, A&O x 3 Uterine fundus:  Firm, nontender Lochia normal Ext:  Edema present, no calf tenderness bilaterally Right hip-Blister 4 cm, nontender, no erythema.  Pt shows a picture of lesion this am.  Appeared to be less than 1 cm.  CBC    Component Value Date/Time   WBC 18.7* 11/30/2013 0610   RBC 3.51* 11/30/2013 0610   HGB 11.1* 11/30/2013 0610   HCT 31.9* 11/30/2013 0610   PLT 187 11/30/2013 0610   MCV 90.9 11/30/2013 0610   MCH 31.6 11/30/2013 0610   MCHC 34.8 11/30/2013 0610   RDW 13.7 11/30/2013 0610   LYMPHSABS 1.0 09/21/2010 1303   MONOABS 0.3 09/21/2010 1303   EOSABS 0.0 09/21/2010 1303   BASOSABS 0.0 09/21/2010 1303     A/P: S/p SVD doing well. Chorioamnionitis (Fever intrapartum, prolonged ROM)-  On Unasyn x 24 hours .  Afebrile Uterine atony- Lochia normal.  On Cytotec x 24 hours.  Blister on right hip.  Etiology unknown.  Benadryl now. Neosporin if it ruptures. Lactation support. Discharge in am. Dr. Richardson Doppole or Charlotta Newtonzan to assess pt in am.  Pt informed. Baby in NICU.  Pt with h/o depression.  At risk for postpartum depression.  2 week f/u to screen for PPD.   Rebecca RankinsVARNADO, Rebecca Tremont 11/30/2013, 1:59 PM

## 2013-11-30 NOTE — Progress Notes (Signed)
MSW intern met with MOB in room 318 due to NICU admission at 41 weeks. MSW intern entered the room to find MOB in bed eating, with two other visitors and their children. MOB's affect presented as appropriate and her mood seemed upbeat and cheerful. MSW intern asked MOB emotionally how she was feeling, since being induced and having a baby in the NICU was not her original birth plan. MOB reported that she was grateful to be induced and is confident that baby will be coming home soon. MSW intern inquired into support systems available to MOB she confirmed that she has "great support on both sides of the family, including close friends." She reported that she would be staying home with baby, as FOB works for [employer] and "makes good money." MOB reports that her and FOB live in a house together and that they have a strong relationship. MSW intern emphasized that their relationship is a strength. MSW intern inquired about MOB's feelings and moods throughout her pregnancy. She reported that she was "relatively calm, but irritable, and FOB definitely experienced those emotions with me." MSW intern asked MOB if she was aware of PPD, she responded that she did and knows that it happens to a lot of moms. MSW intern went on to explain to her that she was at a higher risk of developing PPD due to her baby being in the NICU. MSW intern educated MOB about Baby Blues and PPD. MOB listened intently and MSW intern answered questions to her satisfaction. MSW intern has no further concerns at this time and will follow up PRN.

## 2013-11-30 NOTE — Progress Notes (Signed)
Blister on left thigh burst. Neosporin and dressing applied. Dressing to be changed BID.

## 2013-11-30 NOTE — Anesthesia Postprocedure Evaluation (Signed)
Anesthesia Post Note  Patient: Rebecca Weiss  Procedure(s) Performed: * No procedures listed *  Anesthesia type: Epidural  Patient location: Mother/Baby  Post pain: Pain level controlled  Post assessment: Post-op Vital signs reviewed  Last Vitals:  Filed Vitals:   11/30/13 0300  BP: 115/74  Pulse: 72  Temp: 36.9 C  Resp: 18    Post vital signs: Reviewed  Level of consciousness: awake  Complications: No apparent anesthesia complications

## 2013-11-30 NOTE — Progress Notes (Signed)
Ur chart review completed.  

## 2013-12-01 MED ORDER — BACITRACIN-NEOMYCIN-POLYMYXIN OINTMENT TUBE: TOPICAL_OINTMENT | CUTANEOUS | Status: AC

## 2013-12-01 MED ORDER — IBUPROFEN 600 MG PO TABS: 600.0000 mg | ORAL_TABLET | Freq: Four times a day (QID) | ORAL | Status: AC

## 2013-12-01 NOTE — Progress Notes (Signed)
Post Partum Day 2 Subjective: up ad lib, voiding, tolerating PO and lesion on left  hip   Objective: Blood pressure 126/70, pulse 69, temperature 97.6 F (36.4 C), temperature source Oral, resp. rate 16, height 5' 3.5" (1.613 m), weight 79.379 kg (175 lb), last menstrual period 02/13/2013, SpO2 100.00%, unknown if currently breastfeeding.  Physical Exam:  General: alert and cooperative Lochia: appropriate Uterine Fundus: firm Incision: NA DVT Evaluation: No evidence of DVT seen on physical exam.  skin left hip with deflated "boil" no evidence of infection    Recent Labs  11/30/13 0610  HGB 11.1*  HCT 31.9*    Assessment/Plan: Discharge home and Breastfeeding Left Hip boil... Apply neosporin bid and keep dressed.    LOS: 4 days   Rebecca Weiss J. 12/01/2013, 7:47 AM

## 2013-12-01 NOTE — Discharge Instructions (Signed)

## 2013-12-01 NOTE — Lactation Note (Signed)
This note was copied from the chart of Girl Rebecca Weiss. Lactation Consultation Note  Baby can now breastfeed ad lib and room in tonight.  Met parents in the NICU for feeding assist.  We discussed feeding positions.  I positioned baby in the football hold on the right breast.  Colostrum hand expressed for baby to taste.  Baby opened wide and latched easily.  Baby nursed off and on for 10 minutes. Mom does well with BF techniques.  Baby came off first breast and was positioned in cross cradle on the opposite breast.  After a few attempts baby latched and nursed well. Instructed to feed with any feeding cue.  Discussed breast massage and waking techniques.  LC available for assist today.  Patient Name: Girl Keyry Henegar YJEHU'D Date: 12/01/2013 Reason for consult: Follow-up assessment   Maternal Data    Feeding Feeding Type: Breast Fed Length of feed: 20 min  LATCH Score/Interventions Latch: Grasps breast easily, tongue down, lips flanged, rhythmical sucking.  Audible Swallowing: A few with stimulation Intervention(s): Hand expression;Alternate breast massage  Type of Nipple: Everted at rest and after stimulation  Comfort (Breast/Nipple): Soft / non-tender     Hold (Positioning): Assistance needed to correctly position infant at breast and maintain latch. Intervention(s): Breastfeeding basics reviewed;Support Pillows;Position options  LATCH Score: 8  Lactation Tools Discussed/Used     Consult Status Consult Status: PRN    Ave Filter 12/01/2013, 12:34 PM

## 2013-12-01 NOTE — Progress Notes (Addendum)
Pt verbalizes understanding of d/c instructions, medications, follow up information, belongings policy and when to seek medical attent. Pt has no IV. Pt has no questions at this time. Pt has d/c instructions and prescription in hand. Pt being d/c from nursing care. Will remain a rooming in patient d/t infant status(possible d/c tomorrow).Sheryn BisonGordon, Monteen Toops Warner

## 2013-12-01 NOTE — Progress Notes (Signed)
CSW supervised Haley Baker MSW intern during the following intervention, and is agreement with the interns' documentation.  

## 2013-12-01 NOTE — Lactation Note (Signed)
This note was copied from the chart of Rebecca Zabrina Rotunno. Lactation Consultation Note  Mom is packing up and getting ready for discharge.  She is pumping and obtaining small amounts of colostrum.  [facility] call for a [facility] up time.  Reviewed supply and demand.  Encouraged to call with concerns prn.  Patient Name: Rebecca Weiss ZOXWR'UToday's Date: 12/01/2013 Reason for consult: Initial assessment;NICU baby   Maternal Data    Feeding    LATCH Score/Interventions                      Lactation Tools Discussed/Used     Consult Status Consult Status: PRN    Huston FoleyMOULDEN, Dasan Hardman S 12/01/2013, 10:51 AM

## 2013-12-05 ENCOUNTER — Encounter (HOSPITAL_COMMUNITY): Payer: Self-pay | Admitting: *Deleted

## 2013-12-15 NOTE — Discharge Summary (Signed)
Obstetric Discharge Summary Reason for Admission: induction of labor Prenatal Procedures: ultrasound Intrapartum Procedures: spontaneous vaginal delivery and Shoulder dystocia Postpartum Procedures: none Complications-Operative and Postpartum: Shoulder dystocia with Erb's palsy HEMOGLOBIN  Date Value Ref Range Status  11/30/2013 11.1* 12.0 - 15.0 g/dL Final   HCT  Date Value Ref Range Status  11/30/2013 31.9* 36.0 - 46.0 % Final    Physical Exam:  General: alert, cooperative and no distress Lochia: appropriate Uterine Fundus: firm Incision: N/a DVT Evaluation: No evidence of DVT seen on physical exam. Calf/Ankle edema is present. Pt had a blister on her right hip that burst and was treated with Neosporin.   Discharge Diagnoses: Term Pregnancy-delivered  Discharge Information: Date: 12/15/2013 Activity: pelvic rest Diet: routine Medications: PNV and Ibuprofen Condition: improved Instructions: See discharge instructions. Discharge to: home Follow-up Information    Follow up with Geryl RankinsVARNADO, Gloris Shiroma, MD. Schedule an appointment as soon as possible for a visit in 2 weeks.   Specialty:  Obstetrics and Gynecology   Why:  Postpartum follow up.   Contact information:   [address] [place] [id number] [phone]       Follow up with Geryl RankinsVARNADO, Bowyn Mercier, MD. Schedule an appointment as soon as possible for a visit in 2 weeks.   Specialty:  Obstetrics and Gynecology   Why:  postpartum visit    Contact information:   [address] [place] [id number] [phone]       Newborn Data: Live born female  Birth Weight: 8 lb 0.8 oz (3650 g) APGAR: 2, 3.  Cord ph7.2  Baby in NICU.  Mother roomed in.  Geryl RankinsVARNADO, Ariela Mochizuki 12/15/2013, 9:14 AM

## 2015-01-10 ENCOUNTER — Other Ambulatory Visit: Payer: Self-pay | Admitting: Obstetrics and Gynecology

## 2015-01-10 ENCOUNTER — Other Ambulatory Visit (HOSPITAL_COMMUNITY)
Admission: RE | Admit: 2015-01-10 | Discharge: 2015-01-10 | Disposition: A | Discharge status: Home / Self Care | Payer: Medicaid Other | Source: Ambulatory Visit | Attending: Obstetrics and Gynecology | Admitting: Obstetrics and Gynecology

## 2015-01-10 DIAGNOSIS — Z01419 Encounter for gynecological examination (general) (routine) without abnormal findings: Secondary | ICD-10-CM | POA: Diagnosis not present

## 2015-01-12 LAB — CYTOLOGY - PAP
